# Patient Record
Sex: Male | Born: 1985 | Race: Black or African American | Hispanic: No | Marital: Single | Smoking: Current every day smoker
Health system: Southern US, Community
[De-identification: ages and names within clinical notes are randomized; demographics above are authoritative.]

## PROBLEM LIST (undated history)

## (undated) ENCOUNTER — Ambulatory Visit: Source: Home / Self Care

## (undated) ENCOUNTER — Ambulatory Visit: Admission: EM | Payer: Self-pay | Source: Home / Self Care

---
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
## 2015-09-12 ENCOUNTER — Emergency Department (HOSPITAL_COMMUNITY)
Admission: EM | Admit: 2015-09-12 | Discharge: 2015-09-12 | Disposition: A | Discharge status: Home / Self Care | Payer: Self-pay | Attending: Emergency Medicine | Admitting: Emergency Medicine

## 2015-09-12 ENCOUNTER — Encounter (HOSPITAL_COMMUNITY): Payer: Self-pay | Admitting: Emergency Medicine

## 2015-09-12 DIAGNOSIS — F17200 Nicotine dependence, unspecified, uncomplicated: Secondary | ICD-10-CM | POA: Insufficient documentation

## 2015-09-12 DIAGNOSIS — R369 Urethral discharge, unspecified: Secondary | ICD-10-CM | POA: Insufficient documentation

## 2015-09-12 MED ORDER — LIDOCAINE HCL (PF) 1 % IJ SOLN
5.0000 mL | Freq: Once | INTRAMUSCULAR | Status: AC
  Administered 2015-09-12: 5 mL
  Filled 2015-09-12: qty 5

## 2015-09-12 MED ORDER — AZITHROMYCIN 250 MG PO TABS
1000.0000 mg | ORAL_TABLET | Freq: Once | ORAL | Status: AC
  Administered 2015-09-12: 1000 mg via ORAL
  Filled 2015-09-12: qty 4

## 2015-09-12 MED ORDER — CEFTRIAXONE SODIUM 250 MG IJ SOLR
250.0000 mg | Freq: Once | INTRAMUSCULAR | Status: AC
  Administered 2015-09-12: 250 mg via INTRAMUSCULAR
  Filled 2015-09-12: qty 250

## 2015-09-12 NOTE — ED Notes (Signed)
Pt reports he noticed penile discharge yesterday. Slight burning with urination.

## 2015-09-12 NOTE — ED Provider Notes (Signed)
CSN: [id number]     Arrival date & time 09/12/15  1012 History  By signing my name below, I, Essence Howell, attest that this documentation has been prepared under the direction and in the presence of Roxy Horsemanobert Marticia Reifschneider, PA-C Electronically Signed: Charline BillsEssence Howell, ED Scribe 09/12/2015 at 10:54 AM.   No chief complaint on file.  The history is provided by the patient. No language interpreter was used.   HPI Comments: Gerald [name] is a 30 y.o. male who presents to the Emergency Department with a chief complaint of penile discharge first noticed yesterday. Pt denies new sexual contacts. Pt also denies testicular pain, penile pain, lesions. No known drug allergies.  Reports some associated mild dysuria.   History reviewed. No pertinent past medical history. History reviewed. No pertinent past surgical history. No family history on file. Social History  Substance Use Topics  . Smoking status: Current Every Day Smoker  . Smokeless tobacco: None  . Alcohol Use: No    Review of Systems  Constitutional: Negative for fever.  Genitourinary: Positive for discharge. Negative for genital sores, penile pain and testicular pain.   Allergies  Review of patient's allergies indicates no known allergies.  Home Medications   Prior to Admission medications   Not on File   BP 121/66 mmHg  Pulse 83  Temp(Src) 98.8 F (37.1 C) (Oral)  Resp 14  SpO2 100% Physical Exam Physical Exam  Constitutional: Pt appears well-developed and well-nourished. No distress.  Awake, alert, nontoxic appearance  HENT:  Head: Normocephalic and atraumatic.  Mouth/Throat: Oropharynx is clear and moist. No oropharyngeal exudate.  Eyes: Conjunctivae are normal. No scleral icterus.  Neck: Normal range of motion. Neck supple.  Cardiovascular: Normal rate Pulmonary/Chest: Effort normal and breath sounds normal. No respiratory distress. Equal chest expansion  Abdominal: No distension  Musculoskeletal: Normal range of  motion. Pt exhibits no edema.  GU: No penile discharge. No masses or lesions. No tenderness. No abnormalities. Chaperone present.  Neurological: Pt is alert.  Speech is clear and goal oriented Moves extremities without ataxia  Skin: Skin is warm and dry. Pt is not diaphoretic.  Psychiatric: Pt has a normal mood and affect.  Nursing note and vitals reviewed.  ED Course  Procedures (including critical care time) DIAGNOSTIC STUDIES: Oxygen Saturation is 100% on RA, normal by my interpretation.    COORDINATION OF CARE: 10:40 AM-Discussed treatment plan which includes STD screening, Rocephin and Zithromax with pt at bedside and pt agreed to plan.   Labs Review Labs Reviewed  GC/CHLAMYDIA PROBE AMP ([place]) NOT AT [facility]     MDM   Final diagnoses:  Penile discharge    Discussed importance of using protection when sexually active. Pt understands that they have GC/Chlamydia cultures pending and that they will need to inform all sexual partners if results return positive. Pt has been treated prophylacticly with azithromycin and rocephin due to pts history.   I personally performed the services described in this documentation, which was scribed in my presence. The recorded information has been reviewed and is accurate.     Roxy Horsemanobert Seibert Keeter, PA-C 09/12/15 1103  Donnetta HutchingBrian Cook, MD 09/12/15 1539

## 2015-09-12 NOTE — Discharge Instructions (Signed)
Sexually Transmitted Disease °A sexually transmitted disease (STD) is a disease or infection that may be passed (transmitted) from person to person, usually during sexual activity. This may happen by way of saliva, semen, blood, vaginal mucus, or urine. Common STDs include: °· Gonorrhea. °· Chlamydia. °· Syphilis. °· HIV and AIDS. °· Genital herpes. °· Hepatitis B and C. °· Trichomonas. °· Human papillomavirus (HPV). °· Pubic lice. °· Scabies. °· Mites. °· Bacterial vaginosis. °WHAT ARE CAUSES OF STDs? °An STD may be caused by bacteria, a virus, or parasites. STDs are often transmitted during sexual activity if one person is infected. However, they may also be transmitted through nonsexual means. STDs may be transmitted after:  °· Sexual intercourse with an infected person. °· Sharing sex toys with an infected person. °· Sharing needles with an infected person or using unclean piercing or tattoo needles. °· Having intimate contact with the genitals, mouth, or rectal areas of an infected person. °· Exposure to infected fluids during birth. °WHAT ARE THE SIGNS AND SYMPTOMS OF STDs? °Different STDs have different symptoms. Some people may not have any symptoms. If symptoms are present, they may include: °· Painful or bloody urination. °· Pain in the pelvis, abdomen, vagina, anus, throat, or eyes. °· A skin rash, itching, or irritation. °· Growths, ulcerations, blisters, or sores in the genital and anal areas. °· Abnormal vaginal discharge with or without bad odor. °· Penile discharge in men. °· Fever. °· Pain or bleeding during sexual intercourse. °· Swollen glands in the groin area. °· Yellow skin and eyes (jaundice). This is seen with hepatitis. °· Swollen testicles. °· Infertility. °· Sores and blisters in the mouth. °HOW ARE STDs DIAGNOSED? °To make a diagnosis, your health care provider may: °· Take a medical history. °· Perform a physical exam. °· Take a sample of any discharge to examine. °· Swab the throat,  cervix, opening to the penis, rectum, or vagina for testing. °· Test a sample of your first morning urine. °· Perform blood tests. °· Perform a Pap test, if this applies. °· Perform a colposcopy. °· Perform a laparoscopy. °HOW ARE STDs TREATED? °Treatment depends on the STD. Some STDs may be treated but not cured. °· Chlamydia, gonorrhea, trichomonas, and syphilis can be cured with antibiotic medicine. °· Genital herpes, hepatitis, and HIV can be treated, but not cured, with prescribed medicines. The medicines lessen symptoms. °· Genital warts from HPV can be treated with medicine or by freezing, burning (electrocautery), or surgery. Warts may come back. °· HPV cannot be cured with medicine or surgery. However, abnormal areas may be removed from the cervix, vagina, or vulva. °· If your diagnosis is confirmed, your recent sexual partners need treatment. This is true even if they are symptom-free or have a negative culture or evaluation. They should not have sex until their health care providers say it is okay. °· Your health care provider may test you for infection again 3 months after treatment. °HOW CAN I REDUCE MY RISK OF GETTING AN STD? °Take these steps to reduce your risk of getting an STD: °· Use latex condoms, dental dams, and water-soluble lubricants during sexual activity. Do not use petroleum jelly or oils. °· Avoid having multiple sex partners. °· Do not have sex with someone who has other sex partners °· Do not have sex with anyone you do not know or who is at high risk for an STD. °· Avoid risky sex practices that can break your skin. °· Do not have sex   if you have open sores on your mouth or skin. °· Avoid drinking too much alcohol or taking illegal drugs. Alcohol and drugs can affect your judgment and put you in a vulnerable position. °· Avoid engaging in oral and anal sex acts. °· Get vaccinated for HPV and hepatitis. If you have not received these vaccines in the past, talk to your health care  provider about whether one or both might be right for you. °· If you are at risk of being infected with HIV, it is recommended that you take a prescription medicine daily to prevent HIV infection. This is called pre-exposure prophylaxis (PrEP). You are considered at risk if: °¨ You are a man who has sex with other men (MSM). °¨ You are a heterosexual man or woman and are sexually active with more than one partner. °¨ You take drugs by injection. °¨ You are sexually active with a partner who has HIV. °· Talk with your health care provider about whether you are at high risk of being infected with HIV. If you choose to begin PrEP, you should first be tested for HIV. You should then be tested every 3 months for as long as you are taking PrEP. °WHAT SHOULD I DO IF I THINK I HAVE AN STD? °· See your health care provider. °· Tell your sexual partner(s). They should be tested and treated for any STDs. °· Do not have sex until your health care provider says it is okay. °WHEN SHOULD I GET IMMEDIATE MEDICAL CARE? °Contact your health care provider right away if:  °· You have severe abdominal pain. °· You are a man and notice swelling or pain in your testicles. °· You are a woman and notice swelling or pain in your vagina. °  °This information is not intended to replace advice given to you by your health care provider. Make sure you discuss any questions you have with your health care provider. °  °Document Released: 08/21/2002 Document Revised: 06/21/2014 Document Reviewed: 12/19/2012 °Elsevier Interactive Patient Education ©2016 [employer] ° °

## 2015-09-15 LAB — GC/CHLAMYDIA PROBE AMP (~~LOC~~) NOT AT ARMC
Chlamydia: NEGATIVE
Neisseria Gonorrhea: NEGATIVE

## 2016-01-30 ENCOUNTER — Emergency Department (HOSPITAL_COMMUNITY): Payer: No Typology Code available for payment source

## 2016-01-30 ENCOUNTER — Emergency Department (HOSPITAL_COMMUNITY)
Admission: EM | Admit: 2016-01-30 | Discharge: 2016-01-31 | Disposition: A | Discharge status: Home / Self Care | Payer: No Typology Code available for payment source | Attending: Emergency Medicine | Admitting: Emergency Medicine

## 2016-01-30 ENCOUNTER — Encounter (HOSPITAL_COMMUNITY): Payer: Self-pay | Admitting: Emergency Medicine

## 2016-01-30 DIAGNOSIS — F17200 Nicotine dependence, unspecified, uncomplicated: Secondary | ICD-10-CM | POA: Diagnosis not present

## 2016-01-30 DIAGNOSIS — S60512A Abrasion of left hand, initial encounter: Secondary | ICD-10-CM | POA: Insufficient documentation

## 2016-01-30 DIAGNOSIS — S80212A Abrasion, left knee, initial encounter: Secondary | ICD-10-CM | POA: Insufficient documentation

## 2016-01-30 DIAGNOSIS — S40212A Abrasion of left shoulder, initial encounter: Secondary | ICD-10-CM | POA: Diagnosis not present

## 2016-01-30 DIAGNOSIS — Y999 Unspecified external cause status: Secondary | ICD-10-CM | POA: Insufficient documentation

## 2016-01-30 DIAGNOSIS — S60511A Abrasion of right hand, initial encounter: Secondary | ICD-10-CM | POA: Insufficient documentation

## 2016-01-30 DIAGNOSIS — Z23 Encounter for immunization: Secondary | ICD-10-CM | POA: Diagnosis not present

## 2016-01-30 DIAGNOSIS — Y939 Activity, unspecified: Secondary | ICD-10-CM | POA: Diagnosis not present

## 2016-01-30 DIAGNOSIS — T07XXXA Unspecified multiple injuries, initial encounter: Secondary | ICD-10-CM

## 2016-01-30 DIAGNOSIS — M25532 Pain in left wrist: Secondary | ICD-10-CM | POA: Insufficient documentation

## 2016-01-30 DIAGNOSIS — Y92410 Unspecified street and highway as the place of occurrence of the external cause: Secondary | ICD-10-CM | POA: Diagnosis not present

## 2016-01-30 MED ORDER — TETANUS-DIPHTH-ACELL PERTUSSIS 5-2.5-18.5 LF-MCG/0.5 IM SUSP
0.5000 mL | Freq: Once | INTRAMUSCULAR | Status: AC
  Administered 2016-01-31: 0.5 mL via INTRAMUSCULAR
  Filled 2016-01-30: qty 0.5

## 2016-01-30 MED ORDER — FENTANYL CITRATE (PF) 100 MCG/2ML IJ SOLN
50.0000 ug | Freq: Once | INTRAMUSCULAR | Status: AC
  Administered 2016-01-30: 50 ug via NASAL
  Filled 2016-01-30: qty 2
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
## 2016-01-30 NOTE — ED Notes (Signed)
PT moving both arms above head without difficulty.

## 2016-01-31 MED ORDER — IBUPROFEN 600 MG PO TABS: 600.0000 mg | ORAL_TABLET | Freq: Four times a day (QID) | ORAL | 0 refills | Status: DC | PRN
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
## 2016-11-07 ENCOUNTER — Encounter (HOSPITAL_COMMUNITY): Payer: Self-pay

## 2016-11-07 ENCOUNTER — Emergency Department (HOSPITAL_COMMUNITY)
Admission: EM | Admit: 2016-11-07 | Discharge: 2016-11-07 | Disposition: A | Discharge status: Home / Self Care | Payer: Self-pay | Attending: Emergency Medicine | Admitting: Emergency Medicine

## 2016-11-07 ENCOUNTER — Emergency Department (HOSPITAL_COMMUNITY): Payer: Self-pay

## 2016-11-07 DIAGNOSIS — F17200 Nicotine dependence, unspecified, uncomplicated: Secondary | ICD-10-CM | POA: Insufficient documentation

## 2016-11-07 DIAGNOSIS — N50819 Testicular pain, unspecified: Secondary | ICD-10-CM | POA: Insufficient documentation

## 2016-11-07 DIAGNOSIS — R112 Nausea with vomiting, unspecified: Secondary | ICD-10-CM | POA: Insufficient documentation

## 2016-11-07 DIAGNOSIS — R197 Diarrhea, unspecified: Secondary | ICD-10-CM | POA: Insufficient documentation

## 2016-11-07 LAB — URINALYSIS, ROUTINE W REFLEX MICROSCOPIC
Bilirubin Urine: NEGATIVE
GLUCOSE, UA: NEGATIVE mg/dL
Hgb urine dipstick: NEGATIVE
Ketones, ur: NEGATIVE mg/dL
LEUKOCYTES UA: NEGATIVE
Nitrite: NEGATIVE
PH: 8 (ref 5.0–8.0)
PROTEIN: NEGATIVE mg/dL
SPECIFIC GRAVITY, URINE: 1.011 (ref 1.005–1.030)

## 2016-11-07 LAB — CBC
HEMATOCRIT: 46.7 % (ref 39.0–52.0)
HEMOGLOBIN: 16.8 g/dL (ref 13.0–17.0)
MCH: 32.3 pg (ref 26.0–34.0)
MCHC: 36 g/dL (ref 30.0–36.0)
MCV: 89.8 fL (ref 78.0–100.0)
Platelets: 154 10*3/uL (ref 150–400)
RBC: 5.2 MIL/uL (ref 4.22–5.81)
RDW: 12.8 % (ref 11.5–15.5)
WBC: 4 10*3/uL (ref 4.0–10.5)

## 2016-11-07 LAB — COMPREHENSIVE METABOLIC PANEL
ALT: 13 U/L — ABNORMAL LOW (ref 17–63)
AST: 20 U/L (ref 15–41)
Albumin: 4.5 g/dL (ref 3.5–5.0)
Alkaline Phosphatase: 41 U/L (ref 38–126)
Anion gap: 8 (ref 5–15)
BUN: 10 mg/dL (ref 6–20)
CHLORIDE: 109 mmol/L (ref 101–111)
CO2: 25 mmol/L (ref 22–32)
Calcium: 9.6 mg/dL (ref 8.9–10.3)
Creatinine, Ser: 0.96 mg/dL (ref 0.61–1.24)
Glucose, Bld: 109 mg/dL — ABNORMAL HIGH (ref 65–99)
POTASSIUM: 3.9 mmol/L (ref 3.5–5.1)
SODIUM: 142 mmol/L (ref 135–145)
Total Bilirubin: 0.5 mg/dL (ref 0.3–1.2)
Total Protein: 7.4 g/dL (ref 6.5–8.1)

## 2016-11-07 LAB — LIPASE, BLOOD: LIPASE: 19 U/L (ref 11–51)

## 2016-11-07 MED ORDER — DICYCLOMINE HCL 20 MG PO TABS: 20.0000 mg | ORAL_TABLET | Freq: Two times a day (BID) | ORAL | 0 refills | Status: DC

## 2016-11-07 MED ORDER — KETOROLAC TROMETHAMINE 30 MG/ML IJ SOLN
30.0000 mg | Freq: Once | INTRAMUSCULAR | Status: AC
  Administered 2016-11-07: 30 mg via INTRAVENOUS
  Filled 2016-11-07: qty 1

## 2016-11-07 MED ORDER — SODIUM CHLORIDE 0.9 % IV BOLUS (SEPSIS)
1000.0000 mL | Freq: Once | INTRAVENOUS | Status: AC
  Administered 2016-11-07: 1000 mL via INTRAVENOUS

## 2016-11-07 MED ORDER — ONDANSETRON HCL 4 MG/2ML IJ SOLN
4.0000 mg | Freq: Once | INTRAMUSCULAR | Status: AC
  Administered 2016-11-07: 4 mg via INTRAVENOUS
  Filled 2016-11-07: qty 2

## 2016-11-07 MED ORDER — ONDANSETRON 4 MG PO TBDP: 4.0000 mg | ORAL_TABLET | Freq: Three times a day (TID) | ORAL | 0 refills | Status: DC | PRN

## 2016-11-07 NOTE — ED Notes (Signed)
Pt returned to room from Ultrasound.

## 2016-11-07 NOTE — Discharge Instructions (Signed)
Take the prescribed medication as directed. °Recommend gentle diet for now and progress back to normal as tolerated. °Follow-up with your primary care doctor. °Return to the ED for new or worsening symptoms. °

## 2016-11-07 NOTE — ED Triage Notes (Signed)
Patient of abdominal pain with cramping with vomiting and diarrhea since eating at cookout last pm. States that the pain radiates into his pelvis as well
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
## 2017-02-20 ENCOUNTER — Emergency Department (HOSPITAL_COMMUNITY)
Admission: EM | Admit: 2017-02-20 | Discharge: 2017-02-20 | Disposition: A | Discharge status: Home / Self Care | Payer: No Typology Code available for payment source | Attending: Emergency Medicine | Admitting: Emergency Medicine

## 2017-02-20 ENCOUNTER — Encounter (HOSPITAL_COMMUNITY): Payer: Self-pay

## 2017-02-20 DIAGNOSIS — M542 Cervicalgia: Secondary | ICD-10-CM | POA: Insufficient documentation

## 2017-02-20 DIAGNOSIS — F17210 Nicotine dependence, cigarettes, uncomplicated: Secondary | ICD-10-CM | POA: Insufficient documentation

## 2017-02-20 DIAGNOSIS — M546 Pain in thoracic spine: Secondary | ICD-10-CM | POA: Diagnosis not present

## 2017-02-20 DIAGNOSIS — M545 Low back pain, unspecified: Secondary | ICD-10-CM

## 2017-02-20 MED ORDER — CYCLOBENZAPRINE HCL 10 MG PO TABS: 10.0000 mg | ORAL_TABLET | Freq: Every day | ORAL | 0 refills | Status: DC
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
## 2017-02-20 NOTE — ED Notes (Signed)
Declined W/C at D/C and was escorted to lobby by RN. 

## 2017-02-20 NOTE — Discharge Instructions (Signed)
Alternate 600 mg of ibuprofen and 500-1000 mg of Tylenol every 3 hours as needed for pain. Do not exceed 4000 mg of Tylenol daily. You may take flexeril for muscle relaxation but do not drive, drink alcohol or operate machinery with flexeril use. Ice to areas of soreness for the next few days and then may move to heat. Take hot showers or hot baths and do some gentle stretching to ease muscle stiffness. Take short, frequent walks throughout the day to avoid stiffness and pain. Expect to be sore for the next few day and follow up with primary care physician for recheck of ongoing symptoms but return to ER for emergent changing or worsening of symptoms such as loss of control of bowel or bladder, altered mental status, or abnormal gait not due to pain. ° °
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
## 2017-03-20 ENCOUNTER — Emergency Department (HOSPITAL_COMMUNITY)
Admission: EM | Admit: 2017-03-20 | Discharge: 2017-03-21 | Disposition: A | Discharge status: Home / Self Care | Payer: Self-pay | Attending: Emergency Medicine | Admitting: Emergency Medicine

## 2017-03-20 ENCOUNTER — Encounter (HOSPITAL_COMMUNITY): Payer: Self-pay | Admitting: Emergency Medicine

## 2017-03-20 DIAGNOSIS — R8281 Pyuria: Secondary | ICD-10-CM

## 2017-03-20 DIAGNOSIS — Z5321 Procedure and treatment not carried out due to patient leaving prior to being seen by health care provider: Secondary | ICD-10-CM | POA: Insufficient documentation

## 2017-03-20 DIAGNOSIS — N390 Urinary tract infection, site not specified: Secondary | ICD-10-CM | POA: Insufficient documentation

## 2017-03-20 NOTE — ED Triage Notes (Signed)
Pt comes after reporting being on vacation and came back and noticed he had some penile discharge.  Cannot note color or consistency. Denies pain. Last intercourse on Wednesday and unprotected. Ambulatory. A&O x4.

## 2017-03-21 ENCOUNTER — Emergency Department (HOSPITAL_COMMUNITY)
Admission: EM | Admit: 2017-03-21 | Discharge: 2017-03-21 | Disposition: A | Discharge status: Home / Self Care | Payer: Self-pay | Attending: Emergency Medicine | Admitting: Emergency Medicine

## 2017-03-21 ENCOUNTER — Encounter (HOSPITAL_COMMUNITY): Payer: Self-pay | Admitting: Emergency Medicine

## 2017-03-21 DIAGNOSIS — F17210 Nicotine dependence, cigarettes, uncomplicated: Secondary | ICD-10-CM | POA: Insufficient documentation

## 2017-03-21 DIAGNOSIS — R369 Urethral discharge, unspecified: Secondary | ICD-10-CM | POA: Insufficient documentation

## 2017-03-21 DIAGNOSIS — Z79899 Other long term (current) drug therapy: Secondary | ICD-10-CM | POA: Insufficient documentation

## 2017-03-21 LAB — URINALYSIS, ROUTINE W REFLEX MICROSCOPIC
BACTERIA UA: NONE SEEN
BILIRUBIN URINE: NEGATIVE
Bilirubin Urine: NEGATIVE
Glucose, UA: NEGATIVE mg/dL
Glucose, UA: NEGATIVE mg/dL
KETONES UR: NEGATIVE mg/dL
KETONES UR: NEGATIVE mg/dL
Nitrite: NEGATIVE
Nitrite: NEGATIVE
PH: 6 (ref 5.0–8.0)
Protein, ur: 30 mg/dL — AB
Protein, ur: NEGATIVE mg/dL
SPECIFIC GRAVITY, URINE: 1.013 (ref 1.005–1.030)
SQUAMOUS EPITHELIAL / LPF: NONE SEEN
SQUAMOUS EPITHELIAL / LPF: NONE SEEN
Specific Gravity, Urine: 1.012 (ref 1.005–1.030)
pH: 6 (ref 5.0–8.0)

## 2017-03-21 LAB — GC/CHLAMYDIA PROBE AMP (~~LOC~~) NOT AT ARMC
CHLAMYDIA, DNA PROBE: NEGATIVE
Neisseria Gonorrhea: POSITIVE — AB

## 2017-03-21 MED ORDER — LIDOCAINE HCL (PF) 1 % IJ SOLN
2.0000 mL | Freq: Once | INTRAMUSCULAR | Status: AC
  Administered 2017-03-21: 0.9 mL via INTRADERMAL
  Filled 2017-03-21: qty 5

## 2017-03-21 MED ORDER — AZITHROMYCIN 250 MG PO TABS
1000.0000 mg | ORAL_TABLET | Freq: Once | ORAL | Status: AC
  Administered 2017-03-21: 1000 mg via ORAL
  Filled 2017-03-21: qty 4

## 2017-03-21 MED ORDER — CEFTRIAXONE SODIUM 250 MG IJ SOLR
250.0000 mg | Freq: Once | INTRAMUSCULAR | Status: AC
  Administered 2017-03-21: 250 mg via INTRAMUSCULAR
  Filled 2017-03-21: qty 250
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
## 2017-03-21 NOTE — ED Notes (Signed)
Got a page from triage staff notifying me that pt was seen walking out to the lobby, indicated he would not be coming back.

## 2017-03-21 NOTE — ED Provider Notes (Signed)
[facility]PT Provider Note   CSN: [id number] Arrival date & time: 03/21/17  0100     History   Chief Complaint Chief Complaint  Patient presents with  . Penile Discharge    HPI Gerald [name] is a 31 y.o. male.  Patient presents emergency department with chief complaint penile discharge. He states symptoms started today. He denies any associated fevers, chills, nausea, vomiting, abdominal pain, or testicle pain. States that he does have some associated dysuria. He reports new sexual contacts. He denies any other associated symptoms. He has not taken anything for symptoms.   The history is provided by the patient. No language interpreter was used.    History reviewed. No pertinent past medical history.  There are no active problems to display for this patient.   History reviewed. No pertinent surgical history.     Home Medications    Prior to Admission medications   Medication Sig Start Date End Date Taking? Authorizing Provider  cyclobenzaprine (FLEXERIL) 10 MG tablet Take 1 tablet (10 mg total) by mouth at bedtime. 02/20/17   Fawze, Mina A, PA-C  dicyclomine (BENTYL) 20 MG tablet Take 1 tablet (20 mg total) by mouth 2 (two) times daily. 11/07/16   Garlon Hatchet, PA-C  ibuprofen (ADVIL,MOTRIN) 600 MG tablet Take 1 tablet (600 mg total) by mouth every 6 (six) hours as needed. Patient not taking: Reported on 11/07/2016 01/31/16   Felicie Morn, NP  ondansetron (ZOFRAN ODT) 4 MG disintegrating tablet Take 1 tablet (4 mg total) by mouth every 8 (eight) hours as needed for nausea. 11/07/16   Garlon Hatchet, PA-C    Family History No family history on file.  Social History Social History  Substance Use Topics  . Smoking status: Current Every Day Smoker    Packs/day: 0.40    Types: Cigarettes  . Smokeless tobacco: Never Used  . Alcohol use No     Allergies   Patient has no known allergies.   Review of Systems Review of Systems  All other systems reviewed  and are negative.    Physical Exam Updated Vital Signs BP 118/73 (BP Location: Right Arm)   Pulse 81   Temp 98.2 F (36.8 C) (Oral)   Resp 16   SpO2 100%   Physical Exam  Constitutional: He is oriented to person, place, and time. He appears well-developed and well-nourished.  HENT:  Head: Normocephalic and atraumatic.  Eyes: Conjunctivae and EOM are normal.  Neck: Normal range of motion.  Cardiovascular: Normal rate.   Pulmonary/Chest: Effort normal.  Abdominal: He exhibits no distension.  Genitourinary:  Genitourinary Comments: Thick white discharge from meatus, no testicular tenderness, no masses, lesions, or other abnormality  Musculoskeletal: Normal range of motion.  Neurological: He is alert and oriented to person, place, and time.  Skin: Skin is dry.  Psychiatric: He has a normal mood and affect. His behavior is normal. Judgment and thought content normal.  Nursing note and vitals reviewed.    ED Treatments / Results  Labs (all labs ordered are listed, but only abnormal results are displayed) Labs Reviewed  URINALYSIS, ROUTINE W REFLEX MICROSCOPIC - Abnormal; Notable for the following:       Result Value   APPearance CLOUDY (*)    Hgb urine dipstick SMALL (*)    Leukocytes, UA LARGE (*)    Bacteria, UA RARE (*)    All other components within normal limits    EKG  EKG Interpretation None  Radiology No results found.  Procedures Procedures (including critical care time)  Medications Ordered in ED Medications  cefTRIAXone (ROCEPHIN) injection 250 mg (not administered)  azithromycin (ZITHROMAX) tablet 1,000 mg (not administered)     Initial Impression / Assessment and Plan / ED Course  I have reviewed the triage vital signs and the nursing notes.  Pertinent labs & imaging results that were available during my care of the patient were reviewed by me and considered in my medical decision making (see chart for details).     Patient with  penile discharge.  Cultures pending.  Treated with rocephin and azithro in the ED.  Safe sex instructions given.  No pain or tenderness.  VSS.  Final Clinical Impressions(s) / ED Diagnoses   Final diagnoses:  Penile discharge    New Prescriptions New Prescriptions   No medications on file     Roxy Horseman, Cordelia Poche 03/21/17 0235    Gilda Crease, MD 03/21/17 [phone]

## 2017-03-23 LAB — GC/CHLAMYDIA PROBE AMP (~~LOC~~) NOT AT ARMC
Chlamydia: POSITIVE — AB
NEISSERIA GONORRHEA: POSITIVE — AB

## 2017-12-12 ENCOUNTER — Encounter (HOSPITAL_COMMUNITY): Payer: Self-pay | Admitting: Emergency Medicine

## 2017-12-12 ENCOUNTER — Other Ambulatory Visit: Payer: Self-pay

## 2017-12-12 ENCOUNTER — Emergency Department (HOSPITAL_COMMUNITY)
Admission: EM | Admit: 2017-12-12 | Discharge: 2017-12-12 | Disposition: A | Discharge status: Home / Self Care | Payer: Self-pay | Attending: Emergency Medicine | Admitting: Emergency Medicine

## 2017-12-12 ENCOUNTER — Emergency Department (HOSPITAL_COMMUNITY): Payer: Self-pay

## 2017-12-12 DIAGNOSIS — Y939 Activity, unspecified: Secondary | ICD-10-CM | POA: Insufficient documentation

## 2017-12-12 DIAGNOSIS — Y929 Unspecified place or not applicable: Secondary | ICD-10-CM | POA: Insufficient documentation

## 2017-12-12 DIAGNOSIS — Y999 Unspecified external cause status: Secondary | ICD-10-CM | POA: Insufficient documentation

## 2017-12-12 DIAGNOSIS — W51XXXA Accidental striking against or bumped into by another person, initial encounter: Secondary | ICD-10-CM | POA: Insufficient documentation

## 2017-12-12 DIAGNOSIS — F17210 Nicotine dependence, cigarettes, uncomplicated: Secondary | ICD-10-CM | POA: Insufficient documentation

## 2017-12-12 DIAGNOSIS — Z79899 Other long term (current) drug therapy: Secondary | ICD-10-CM | POA: Insufficient documentation

## 2017-12-12 DIAGNOSIS — S63502A Unspecified sprain of left wrist, initial encounter: Secondary | ICD-10-CM | POA: Insufficient documentation
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
## 2017-12-12 NOTE — Discharge Instructions (Signed)
Please wear splint until you are able to follow up with Dr. Izora Ribasoley (hand doctor) Take Ibuprofen three times daily for pain and swelling
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
## 2017-12-12 NOTE — ED Notes (Signed)
Patient transported to X-ray 

## 2019-07-09 ENCOUNTER — Encounter (HOSPITAL_COMMUNITY): Payer: Self-pay

## 2019-07-09 ENCOUNTER — Emergency Department (HOSPITAL_COMMUNITY)
Admission: EM | Admit: 2019-07-09 | Discharge: 2019-07-09 | Disposition: A | Discharge status: Home / Self Care | Payer: Self-pay | Attending: Emergency Medicine | Admitting: Emergency Medicine

## 2019-07-09 DIAGNOSIS — Z202 Contact with and (suspected) exposure to infections with a predominantly sexual mode of transmission: Secondary | ICD-10-CM | POA: Insufficient documentation

## 2019-07-09 DIAGNOSIS — R300 Dysuria: Secondary | ICD-10-CM | POA: Insufficient documentation

## 2019-07-09 DIAGNOSIS — F17210 Nicotine dependence, cigarettes, uncomplicated: Secondary | ICD-10-CM | POA: Insufficient documentation

## 2019-07-09 DIAGNOSIS — Z79899 Other long term (current) drug therapy: Secondary | ICD-10-CM | POA: Insufficient documentation

## 2019-07-09 LAB — URINALYSIS, ROUTINE W REFLEX MICROSCOPIC
Bilirubin Urine: NEGATIVE
Glucose, UA: NEGATIVE mg/dL
Hgb urine dipstick: NEGATIVE
Ketones, ur: NEGATIVE mg/dL
Nitrite: NEGATIVE
Protein, ur: NEGATIVE mg/dL
Specific Gravity, Urine: 1.013 (ref 1.005–1.030)
pH: 5 (ref 5.0–8.0)

## 2019-07-09 MED ORDER — DOXYCYCLINE HYCLATE 100 MG PO CAPS: 100.0000 mg | ORAL_CAPSULE | Freq: Two times a day (BID) | ORAL | 0 refills | Status: DC

## 2019-07-09 MED ORDER — STERILE WATER FOR INJECTION IJ SOLN
INTRAMUSCULAR | Status: AC
  Administered 2019-07-09: 10 mL
  Filled 2019-07-09: qty 10

## 2019-07-09 MED ORDER — CEFTRIAXONE SODIUM 1 G IJ SOLR
500.0000 mg | Freq: Once | INTRAMUSCULAR | Status: AC
  Administered 2019-07-09: 500 mg via INTRAMUSCULAR
  Filled 2019-07-09: qty 10

## 2019-07-09 MED ORDER — AZITHROMYCIN 250 MG PO TABS
1000.0000 mg | ORAL_TABLET | Freq: Once | ORAL | Status: AC
  Administered 2019-07-09: 1000 mg via ORAL
  Filled 2019-07-09: qty 4
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
## 2019-07-09 NOTE — ED Provider Notes (Signed)
[facility] DEPT Provider Note   CSN: [id number] Arrival date & time: 07/09/19  0102     History Chief Complaint  Patient presents with  . Exposure to STD    Gerald [name] is a 34 y.o. male.  Patient states that a woman he had sex with told him that she has an STD.  Patient denies any symptoms himself but has been anxious since.  He has a little bit of tingling when he urinates.  There has been no penile discharge.  No hematuria but some dysuria.  No fever, chills, nausea, vomiting.  No abdominal pain.  No chest pain or shortness of breath. His last sexual encounter was 4 days ago.  Does not use condoms.  The history is provided by the patient.  Exposure to STD Pertinent negatives include no chest pain, no abdominal pain, no headaches and no shortness of breath.       History reviewed. No pertinent past medical history.  There are no problems to display for this patient.   History reviewed. No pertinent surgical history.     No family history on file.  Social History   Tobacco Use  . Smoking status: Current Every Day Smoker    Packs/day: 0.40    Types: Cigarettes  . Smokeless tobacco: Never Used  Substance Use Topics  . Alcohol use: No  . Drug use: No    Home Medications Prior to Admission medications   Medication Sig Start Date End Date Taking? Authorizing Provider  cyclobenzaprine (FLEXERIL) 10 MG tablet Take 1 tablet (10 mg total) by mouth at bedtime. 02/20/17   Fawze, Mina A, PA-C  dicyclomine (BENTYL) 20 MG tablet Take 1 tablet (20 mg total) by mouth 2 (two) times daily. 11/07/16   Larene Pickett, PA-C  ibuprofen (ADVIL,MOTRIN) 600 MG tablet Take 1 tablet (600 mg total) by mouth every 6 (six) hours as needed. Patient not taking: Reported on 11/07/2016 01/31/16   Etta Quill, NP  ondansetron (ZOFRAN ODT) 4 MG disintegrating tablet Take 1 tablet (4 mg total) by mouth every 8 (eight) hours as needed for nausea. 11/07/16   Larene Pickett, PA-C    Allergies    Patient has no known allergies.  Review of Systems   Review of Systems  Constitutional: Negative for activity change, appetite change and fever.  HENT: Negative for congestion.   Respiratory: Negative for cough, chest tightness and shortness of breath.   Cardiovascular: Negative for chest pain.  Gastrointestinal: Negative for abdominal pain, nausea and vomiting.  Genitourinary: Positive for dysuria. Negative for decreased urine volume, discharge, hematuria, penile pain, scrotal swelling, testicular pain and urgency.  Musculoskeletal: Negative for arthralgias and myalgias.  Skin: Negative for rash.  Neurological: Negative for weakness and headaches.    all other systems are negative except as noted in the HPI and PMH.   Physical Exam Updated Vital Signs BP 126/78 (BP Location: Right Arm)   Pulse 86   Temp 98.3 F (36.8 C)   Resp 16   SpO2 100%   Physical Exam Vitals and nursing note reviewed.  Constitutional:      General: He is not in acute distress.    Appearance: He is well-developed.     Comments: Smells of marijuana  HENT:     Head: Normocephalic and atraumatic.     Mouth/Throat:     Pharynx: No oropharyngeal exudate.  Eyes:     Conjunctiva/sclera: Conjunctivae normal.     Pupils: Pupils  are equal, round, and reactive to light.  Neck:     Comments: No meningismus. Cardiovascular:     Rate and Rhythm: Normal rate and regular rhythm.     Heart sounds: Normal heart sounds. No murmur.  Pulmonary:     Effort: Pulmonary effort is normal. No respiratory distress.     Breath sounds: Normal breath sounds.  Abdominal:     Palpations: Abdomen is soft.     Tenderness: There is no abdominal tenderness. There is no guarding or rebound.  Genitourinary:    Comments: No rashes.  No penile discharge.  Normal circumcised penis.  No testicular tenderness Musculoskeletal:        General: No tenderness. Normal range of motion.     Cervical back:  Normal range of motion and neck supple.  Skin:    General: Skin is warm.  Neurological:     Mental Status: He is alert and oriented to person, place, and time.     Cranial Nerves: No cranial nerve deficit.     Motor: No abnormal muscle tone.     Coordination: Coordination normal.     Comments: No ataxia on finger to nose bilaterally. No pronator drift. 5/5 strength throughout. CN 2-12 intact.Equal grip strength. Sensation intact.   Psychiatric:        Behavior: Behavior normal.     ED Results / Procedures / Treatments   Labs (all labs ordered are listed, but only abnormal results are displayed) Labs Reviewed  URINALYSIS, ROUTINE W REFLEX MICROSCOPIC - Abnormal; Notable for the following components:      Result Value   Leukocytes,Ua TRACE (*)    Bacteria, UA RARE (*)    All other components within normal limits  URINE CULTURE    EKG None  Radiology No results found.  Procedures Procedures (including critical care time)  Medications Ordered in ED Medications  cefTRIAXone (ROCEPHIN) injection 500 mg (has no administration in time range)  azithromycin (ZITHROMAX) tablet 1,000 mg (has no administration in time range)    ED Course  I have reviewed the triage vital signs and the nursing notes.  Pertinent labs & imaging results that were available during my care of the patient were reviewed by me and considered in my medical decision making (see chart for details).    MDM Rules/Calculators/A&P                      Patient here with possible STD exposure.  He has no symptoms.  No fever. Abdomen soft without peritoneal signs.  We will treat empirically with IM Rocephin as well as p.o. doxycycline.  Safe sex practices discussed.  Follow-up with health department.  Return precautions discussed Final Clinical Impression(s) / ED Diagnoses Final diagnoses:  STD exposure    Rx / DC Orders ED Discharge Orders    None       Tylin Stradley, Jeannett Senior, MD 07/09/19 1111

## 2019-07-09 NOTE — Discharge Instructions (Signed)
Use condoms for every sexual encounter.  Follow-up with the health department.  Return to the ED with new or worsening symptoms.

## 2019-07-10 LAB — URINE CULTURE: Culture: NO GROWTH

## 2019-09-29 ENCOUNTER — Emergency Department (HOSPITAL_COMMUNITY)
Admission: EM | Admit: 2019-09-29 | Discharge: 2019-09-29 | Disposition: A | Discharge status: Home / Self Care | Payer: Self-pay | Attending: Emergency Medicine | Admitting: Emergency Medicine

## 2019-09-29 ENCOUNTER — Other Ambulatory Visit: Payer: Self-pay

## 2019-09-29 ENCOUNTER — Encounter (HOSPITAL_COMMUNITY): Payer: Self-pay

## 2019-09-29 DIAGNOSIS — F17210 Nicotine dependence, cigarettes, uncomplicated: Secondary | ICD-10-CM | POA: Insufficient documentation

## 2019-09-29 DIAGNOSIS — K0889 Other specified disorders of teeth and supporting structures: Secondary | ICD-10-CM | POA: Insufficient documentation

## 2019-09-29 MED ORDER — PENICILLIN V POTASSIUM 500 MG PO TABS: 500.0000 mg | ORAL_TABLET | Freq: Four times a day (QID) | ORAL | 0 refills | Status: AC

## 2019-09-29 MED ORDER — CHLORHEXIDINE GLUCONATE 0.12 % MT SOLN: 15.0000 mL | Freq: Two times a day (BID) | OROMUCOSAL | 0 refills | Status: DC

## 2019-09-29 NOTE — ED Notes (Signed)
Patient verbalizes understanding of discharge instructions. Opportunity for questioning and answers were provided. Armband removed by staff, pt discharged from ED. Pt. ambulatory and discharged home.  

## 2019-09-29 NOTE — ED Provider Notes (Signed)
[facility] EMERGENCY DEPARTMENT Provider Note   CSN: [id number] Arrival date & time: 09/29/19  1700     History Chief Complaint  Patient presents with  . Dental Pain    Gerald [name] is a 34 y.o. male.  Gerald [name] is a 34 y.o. male who is otherwise healthy, presents to the emergency department with dental pain.  He reports last night while eating an apple his left lower posterior molar broke, and he spit out a piece of tooth.  He reports since then he has had some throbbing pain around the tooth that is a constant ache.  He has not taken anything to treat this pain prior to arrival.  No drainage from the area.  No pain or swelling underneath the tongue.  No fevers or chills.  Chewing is painful but he has been able to eat and drink.  Does not have a dentist that he sees.  No other aggravating or alleviating factors.        History reviewed. No pertinent past medical history.  There are no problems to display for this patient.   History reviewed. No pertinent surgical history.     No family history on file.  Social History   Tobacco Use  . Smoking status: Current Every Day Smoker    Packs/day: 0.40    Types: Cigarettes  . Smokeless tobacco: Never Used  Substance Use Topics  . Alcohol use: No  . Drug use: No    Home Medications Prior to Admission medications   Medication Sig Start Date End Date Taking? Authorizing Provider  chlorhexidine (PERIDEX) 0.12 % solution Use as directed 15 mLs in the mouth or throat 2 (two) times daily. 09/29/19   Dartha Lodge, PA-C  cyclobenzaprine (FLEXERIL) 10 MG tablet Take 1 tablet (10 mg total) by mouth at bedtime. Patient not taking: Reported on 07/09/2019 02/20/17   Michela Pitcher A, PA-C  dicyclomine (BENTYL) 20 MG tablet Take 1 tablet (20 mg total) by mouth 2 (two) times daily. Patient not taking: Reported on 07/09/2019 11/07/16   Garlon Hatchet, PA-C  doxycycline (VIBRAMYCIN) 100 MG capsule Take 1 capsule  (100 mg total) by mouth 2 (two) times daily. 07/09/19   Rancour, Jeannett Senior, MD  ibuprofen (ADVIL,MOTRIN) 600 MG tablet Take 1 tablet (600 mg total) by mouth every 6 (six) hours as needed. Patient not taking: Reported on 11/07/2016 01/31/16   Felicie Morn, NP  ondansetron (ZOFRAN ODT) 4 MG disintegrating tablet Take 1 tablet (4 mg total) by mouth every 8 (eight) hours as needed for nausea. Patient not taking: Reported on 07/09/2019 11/07/16   Garlon Hatchet, PA-C  penicillin v potassium (VEETID) 500 MG tablet Take 1 tablet (500 mg total) by mouth 4 (four) times daily for 7 days. 09/29/19 10/06/19  Dartha Lodge, PA-C    Allergies    Patient has no known allergies.  Review of Systems   Review of Systems  Constitutional: Negative for chills and fever.  HENT: Positive for dental problem. Negative for facial swelling, trouble swallowing and voice change.   Gastrointestinal: Negative for nausea and vomiting.  Musculoskeletal: Negative for neck pain and neck stiffness.    Physical Exam Updated Vital Signs BP 107/81 (BP Location: Right Arm)   Pulse 73   Temp 98.5 F (36.9 C) (Oral)   Resp 14   Ht 5\' 9"  (1.753 m)   Wt 63.5 kg   SpO2 98%   BMI 20.67 kg/m   Physical  Exam Vitals and nursing note reviewed.  Constitutional:      General: He is not in acute distress.    Appearance: Normal appearance. He is well-developed and normal weight. He is not diaphoretic.  HENT:     Head: Normocephalic and atraumatic.     Mouth/Throat:     Comments: Left lower molar is broken, no bleeding or purulent drainage noted no erythema of the surrounding gums or evident abscess, multiple teeth noted to have some decay.  Posterior oropharynx is clear, tolerating secretions, normal phonation, no sublingual tenderness Eyes:     General:        Right eye: No discharge.        Left eye: No discharge.  Pulmonary:     Effort: Pulmonary effort is normal. No respiratory distress.  Musculoskeletal:     Cervical back:  Neck supple. No tenderness.  Skin:    General: Skin is warm and dry.  Neurological:     Mental Status: He is alert and oriented to person, place, and time.     Coordination: Coordination normal.  Psychiatric:        Mood and Affect: Mood normal.        Behavior: Behavior normal.     ED Results / Procedures / Treatments   Labs (all labs ordered are listed, but only abnormal results are displayed) Labs Reviewed - No data to display  EKG None  Radiology No results found.  Procedures Procedures (including critical care time)  Medications Ordered in ED Medications - No data to display  ED Course  I have reviewed the triage vital signs and the nursing notes.  Pertinent labs & imaging results that were available during my care of the patient were reviewed by me and considered in my medical decision making (see chart for details).    MDM Rules/Calculators/A&P                     34 year old male presents with broken left lower posterior molar.  This has been broken for almost 24 hours now, and is broken down to the gumline so do not feel that patient is a candidate for having the tooth sealed with dental cement.  Will place on prophylactic antibiotics and have patient use Peridex mouthwash.  Ibuprofen and Tylenol for pain.  Provided a list of dental resources for follow-up.  Discussed appropriate return precautions.  Patient expresses understanding and agreement.  Discharged home in good condition.  Final Clinical Impression(s) / ED Diagnoses Final diagnoses:  Tooth pain    Rx / DC Orders ED Discharge Orders         Ordered    penicillin v potassium (VEETID) 500 MG tablet  4 times daily     09/29/19 1933    chlorhexidine (PERIDEX) 0.12 % solution  2 times daily     09/29/19 1933           [name] 09/29/19 1945    [name], MD 09/30/19 1108

## 2019-09-29 NOTE — Discharge Instructions (Signed)
Please take entire course of antibiotics as directed.  Continue using ibuprofen and Tylenol, as well as prescribed Orajel for pain.  You will need to follow-up with your dentist for continued management of this.  Return to the emergency department for fevers, swelling or pain under the tongue or in the neck, difficulty breathing or swallowing or any other new or concerning symptoms. 

## 2019-09-29 NOTE — ED Triage Notes (Signed)
Patient complains of left lower dental pain for 1 day, states where wisdom tooth has come through

## 2019-12-11 ENCOUNTER — Encounter (HOSPITAL_COMMUNITY): Payer: Self-pay | Admitting: *Deleted

## 2019-12-11 ENCOUNTER — Other Ambulatory Visit: Payer: Self-pay

## 2019-12-11 ENCOUNTER — Emergency Department (HOSPITAL_COMMUNITY)
Admission: EM | Admit: 2019-12-11 | Discharge: 2019-12-11 | Disposition: A | Discharge status: Home / Self Care | Payer: Self-pay | Attending: Emergency Medicine | Admitting: Emergency Medicine

## 2019-12-11 DIAGNOSIS — F17210 Nicotine dependence, cigarettes, uncomplicated: Secondary | ICD-10-CM | POA: Insufficient documentation

## 2019-12-11 DIAGNOSIS — K0889 Other specified disorders of teeth and supporting structures: Secondary | ICD-10-CM | POA: Insufficient documentation

## 2019-12-11 MED ORDER — NAPROXEN 500 MG PO TABS: 500.0000 mg | ORAL_TABLET | Freq: Two times a day (BID) | ORAL | 0 refills | Status: DC

## 2019-12-11 MED ORDER — PENICILLIN V POTASSIUM 500 MG PO TABS: 500.0000 mg | ORAL_TABLET | Freq: Three times a day (TID) | ORAL | 0 refills | Status: AC

## 2019-12-11 NOTE — ED Provider Notes (Signed)
[facility]-EMERGENCY DEPT Provider Note   CSN: [id number] Arrival date & time: 12/11/19  0944    History Chief Complaint  Patient presents with  . Dental Pain    Gerald [name] is a 34 y.o. male with no significant past medical history who presents for evaluation of dental pain.  Patient with multiple fractured teeth to his left lower dentition.  Patient states he recently chipped an additional tooth.  He was seen by dentistry previously who told him it was going to be a certain amount and patient states he cannot afford it.  Patient states "cant you remove tooth."  No headache, lightheadedness, dizziness, drooling, dysphagia, trismus, bleeding, neck pain, neck stiffness, fever, chills, nausea, vomiting, chest pain, shortness of breath or cough.  Denies aggravating or relieving factors.  States he did take antibiotics approximately 1 month ago.  History obtained from patient and past medical records.  No interpreter used.  HPI     History reviewed. No pertinent past medical history.  There are no problems to display for this patient.   History reviewed. No pertinent surgical history.     No family history on file.  Social History   Tobacco Use  . Smoking status: Current Every Day Smoker    Packs/day: 0.40    Types: Cigarettes  . Smokeless tobacco: Never Used  Substance Use Topics  . Alcohol use: No  . Drug use: No    Home Medications Prior to Admission medications   Medication Sig Start Date End Date Taking? Authorizing Provider  chlorhexidine (PERIDEX) 0.12 % solution Use as directed 15 mLs in the mouth or throat 2 (two) times daily. 09/29/19   Dartha Lodge, PA-C  cyclobenzaprine (FLEXERIL) 10 MG tablet Take 1 tablet (10 mg total) by mouth at bedtime. Patient not taking: Reported on 07/09/2019 02/20/17   Michela Pitcher A, PA-C  dicyclomine (BENTYL) 20 MG tablet Take 1 tablet (20 mg total) by mouth 2 (two) times daily. Patient not taking: Reported  on 07/09/2019 11/07/16   Garlon Hatchet, PA-C  doxycycline (VIBRAMYCIN) 100 MG capsule Take 1 capsule (100 mg total) by mouth 2 (two) times daily. 07/09/19   Rancour, Jeannett Senior, MD  ibuprofen (ADVIL,MOTRIN) 600 MG tablet Take 1 tablet (600 mg total) by mouth every 6 (six) hours as needed. Patient not taking: Reported on 11/07/2016 01/31/16   Felicie Morn, NP  naproxen (NAPROSYN) 500 MG tablet Take 1 tablet (500 mg total) by mouth 2 (two) times daily. 12/11/19   Meighan Treto A, PA-C  ondansetron (ZOFRAN ODT) 4 MG disintegrating tablet Take 1 tablet (4 mg total) by mouth every 8 (eight) hours as needed for nausea. Patient not taking: Reported on 07/09/2019 11/07/16   Garlon Hatchet, PA-C  penicillin v potassium (VEETID) 500 MG tablet Take 1 tablet (500 mg total) by mouth 3 (three) times daily for 7 days. 12/11/19 12/18/19  Isella Slatten A, PA-C    Allergies    Patient has no known allergies.  Review of Systems   Review of Systems  Constitutional: Negative.   HENT: Positive for dental problem and facial swelling. Negative for congestion, ear discharge, ear pain, mouth sores, nosebleeds, postnasal drip, rhinorrhea, sinus pressure, sinus pain, sneezing, sore throat, trouble swallowing and voice change.   Eyes: Negative.   Respiratory: Negative.   Cardiovascular: Negative.   Gastrointestinal: Negative.   Musculoskeletal: Negative for neck pain and neck stiffness.  Skin: Negative.   Neurological: Negative.   All other systems reviewed  and are negative.   Physical Exam Updated Vital Signs BP 129/80 (BP Location: Right Arm)   Pulse 85   Temp 98.2 F (36.8 C) (Oral)   Resp 16   Ht 5\' 9"  (1.753 m)   Wt 63.5 kg   SpO2 100%   BMI 20.67 kg/m   Physical Exam Vitals and nursing note reviewed.  Constitutional:      General: He is not in acute distress.    Appearance: He is well-developed. He is not ill-appearing, toxic-appearing or diaphoretic.  HENT:     Head: Atraumatic.     Jaw: There is  normal jaw occlusion.     Comments: Minimal left-sided facial swelling to superior to mandible.  No extension into submandibular region.  No evidence of Ludwig's angina    Mouth/Throat:     Lips: Pink.     Mouth: Mucous membranes are moist.     Dentition: Abnormal dentition. Dental tenderness and dental caries present. No gingival swelling or dental abscesses.     Tongue: No lesions.     Palate: No mass.     Pharynx: Oropharynx is clear. Uvula midline.     Tonsils: No tonsillar exudate or tonsillar abscesses.      Comments: Poor dentition with multiple cracked teeth and dental caries.  Some gingival erythema to left lower posterior dentition.  No drainable periapical abscess.  No intraoral lesions.  Sublingual area soft.  No drooling, dysphagia or trismus Eyes:     Pupils: Pupils are equal, round, and reactive to light.  Neck:     Trachea: Trachea and phonation normal.     Comments: No neck stiffness or neck rigidity.  Phonation normal Cardiovascular:     Rate and Rhythm: Normal rate and regular rhythm.     Pulses: Normal pulses.     Heart sounds: Normal heart sounds.  Pulmonary:     Effort: Pulmonary effort is normal. No respiratory distress.     Breath sounds: Normal breath sounds and air entry.     Comments: Clear to auscultation without wheeze, rhonchi or rales.  No stridor Abdominal:     General: There is no distension.     Palpations: Abdomen is soft.  Musculoskeletal:        General: Normal range of motion.     Cervical back: Full passive range of motion without pain, normal range of motion and neck supple.  Lymphadenopathy:     Cervical: No cervical adenopathy.  Skin:    General: Skin is warm and dry.     Capillary Refill: Capillary refill takes less than 2 seconds.     Comments: No erythema, warmth, fluctuance or induration.  Neurological:     General: No focal deficit present.     Mental Status: He is alert.     Cranial Nerves: Cranial nerves are intact.      Sensory: Sensation is intact.     Comments: Cranial nerves II through XII grossly intact     ED Results / Procedures / Treatments   Labs (all labs ordered are listed, but only abnormal results are displayed) Labs Reviewed - No data to display  EKG None  Radiology No results found.  Procedures Procedures (including critical care time)  Medications Ordered in ED Medications - No data to display  ED Course  I have reviewed the triage vital signs and the nursing notes.  Pertinent labs & imaging results that were available during my care of the patient were reviewed by me and  considered in my medical decision making (see chart for details).  34 year old male patient otherwise well presents for evaluation of dental pain.  He has multiple dental caries and fractured dentition. This appears to be an ongoing issue and he has been seen for previous in past.  He is afebrile, nonseptic, non-ill-appearing.  He does have some very minimal superior mandibular swelling however does not extend into the submandibular region.  No evidence of Ludwig's angina.  Sublingual area soft.  No drooling, dysphagia or trismus.  Low suspicion for deep space infection.  No evidence of drainable periapical abscess.  Will start on anti-inflammatories, antibiotics and have him follow-up outpatient with dentistry.  The patient has been appropriately medically screened and/or stabilized in the ED. I have low suspicion for any other emergent medical condition which would require further screening, evaluation or treatment in the ED or require inpatient management.  Patient is hemodynamically stable and in no acute distress.  Patient able to ambulate in department prior to ED.  Evaluation does not show acute pathology that would require ongoing or additional emergent interventions while in the emergency department or further inpatient treatment.  I have discussed the diagnosis with the patient and answered all questions.   Pain is been managed while in the emergency department and patient has no further complaints prior to discharge.  Patient is comfortable with plan discussed in room and is stable for discharge at this time.  I have discussed strict return precautions for returning to the emergency department.  Patient was encouraged to follow-up with PCP/specialist refer to at discharge.    MDM Rules/Calculators/A&P                           Final Clinical Impression(s) / ED Diagnoses Final diagnoses:  Pain, dental    Rx / DC Orders ED Discharge Orders         Ordered    naproxen (NAPROSYN) 500 MG tablet  2 times daily     Discontinue  Reprint     12/11/19 1043    penicillin v potassium (VEETID) 500 MG tablet  3 times daily     Discontinue  Reprint     12/11/19 1043           Fatmata Legere A, PA-C 12/11/19 1053    Terrilee Files, MD 12/11/19 1758

## 2019-12-11 NOTE — Discharge Instructions (Signed)
Take medication as prescribed.  Follow-up outpatient with dentistry

## 2019-12-11 NOTE — ED Triage Notes (Signed)
Pt has dental pain 3 tooth from left back, some swelling noted.Broken tooth

## 2020-02-15 ENCOUNTER — Encounter: Payer: Self-pay | Admitting: Physician Assistant

## 2020-02-15 ENCOUNTER — Ambulatory Visit
Admission: EM | Admit: 2020-02-15 | Discharge: 2020-02-15 | Disposition: A | Discharge status: Home / Self Care | Payer: Self-pay | Attending: Physician Assistant | Admitting: Physician Assistant

## 2020-02-15 DIAGNOSIS — Z202 Contact with and (suspected) exposure to infections with a predominantly sexual mode of transmission: Secondary | ICD-10-CM | POA: Insufficient documentation
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
## 2020-02-15 NOTE — Discharge Instructions (Signed)
Cytology sent, you will be contacted with any positive results that requires further treatment. Refrain from sexual activity for the next 7 days. If developing testicular swelling/pain, penile lesion/sore, follow up for reevaluation.   
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
## 2020-02-19 LAB — CYTOLOGY, (ORAL, ANAL, URETHRAL) ANCILLARY ONLY
Chlamydia: NEGATIVE
Comment: NEGATIVE
Comment: NEGATIVE
Comment: NORMAL
Neisseria Gonorrhea: NEGATIVE
Trichomonas: NEGATIVE
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
## 2021-02-04 ENCOUNTER — Emergency Department (HOSPITAL_BASED_OUTPATIENT_CLINIC_OR_DEPARTMENT_OTHER)
Admission: EM | Admit: 2021-02-04 | Discharge: 2021-02-04 | Disposition: A | Discharge status: Home / Self Care | Payer: Self-pay | Attending: Emergency Medicine | Admitting: Emergency Medicine

## 2021-02-04 ENCOUNTER — Other Ambulatory Visit: Payer: Self-pay

## 2021-02-04 ENCOUNTER — Encounter (HOSPITAL_BASED_OUTPATIENT_CLINIC_OR_DEPARTMENT_OTHER): Payer: Self-pay | Admitting: Obstetrics and Gynecology

## 2021-02-04 ENCOUNTER — Encounter: Payer: Self-pay | Admitting: Emergency Medicine

## 2021-02-04 ENCOUNTER — Ambulatory Visit
Admission: EM | Admit: 2021-02-04 | Discharge: 2021-02-04 | Disposition: A | Discharge status: Home / Self Care | Payer: Self-pay | Attending: Urgent Care | Admitting: Urgent Care

## 2021-02-04 DIAGNOSIS — F17210 Nicotine dependence, cigarettes, uncomplicated: Secondary | ICD-10-CM | POA: Insufficient documentation

## 2021-02-04 DIAGNOSIS — Z7251 High risk heterosexual behavior: Secondary | ICD-10-CM | POA: Insufficient documentation

## 2021-02-04 DIAGNOSIS — S31805A Open bite of unspecified buttock, initial encounter: Secondary | ICD-10-CM | POA: Insufficient documentation

## 2021-02-04 DIAGNOSIS — Z202 Contact with and (suspected) exposure to infections with a predominantly sexual mode of transmission: Secondary | ICD-10-CM | POA: Insufficient documentation

## 2021-02-04 DIAGNOSIS — W540XXA Bitten by dog, initial encounter: Secondary | ICD-10-CM | POA: Insufficient documentation

## 2021-02-04 MED ORDER — AMOXICILLIN-POT CLAVULANATE 875-125 MG PO TABS: 1.0000 | ORAL_TABLET | Freq: Two times a day (BID) | ORAL | 0 refills | Status: DC

## 2021-02-04 MED ORDER — AMOXICILLIN-POT CLAVULANATE 875-125 MG PO TABS
1.0000 | ORAL_TABLET | Freq: Once | ORAL | Status: AC
  Administered 2021-02-04: 1 via ORAL
  Filled 2021-02-04: qty 1

## 2021-02-04 NOTE — ED Triage Notes (Signed)
Patient requesting STD testing, no sx's.  No discharge.  No dysuria.

## 2021-02-04 NOTE — ED Provider Notes (Signed)
[facility] EMERGENCY DEPT Provider Note   CSN: [id number] Arrival date & time: 02/04/21  1918     History Chief Complaint  Patient presents with   Animal Bite    Gerald [name] is a 35 y.o. male.  Patient is a 35 yo male presenting for dog bite. Patient states at 745AM this morning he was peeing in the bushes outsides when he was bit by a domestic dog on the buttocks. Admits to pain. Denies redness, swelling, or drainage. Denies other injuries at this time. Tdap up to date-2017.   The history is provided by the patient. No language interpreter was used.  Animal Bite Contact animal:  Dog Animal bite location: buttocks. Pain details:    Quality:  Aching   Severity:  Mild Associated symptoms: no fever and no rash       History reviewed. No pertinent past medical history.  There are no problems to display for this patient.   History reviewed. No pertinent surgical history.     Family History  Problem Relation Age of Onset   Healthy Mother    Healthy Father     Social History   Tobacco Use   Smoking status: Every Day    Packs/day: 0.20    Years: 7.00    Pack years: 1.40    Types: Cigarettes   Smokeless tobacco: Never  Vaping Use   Vaping Use: Never used  Substance Use Topics   Alcohol use: No   Drug use: Yes    Types: Marijuana    Home Medications Prior to Admission medications   Medication Sig Start Date End Date Taking? Authorizing Provider  dicyclomine (BENTYL) 20 MG tablet Take 1 tablet (20 mg total) by mouth 2 (two) times daily. Patient not taking: Reported on 07/09/2019 11/07/16 02/15/20  Garlon Hatchet, PA-C    Allergies    Patient has no known allergies.  Review of Systems   Review of Systems  Constitutional:  Negative for chills and fever.  Eyes:  Negative for pain and visual disturbance.  Respiratory:  Negative for cough and shortness of breath.   Cardiovascular:  Negative for chest pain and palpitations.   Gastrointestinal:  Negative for abdominal pain and vomiting.  Genitourinary:  Negative for dysuria and hematuria.  Musculoskeletal:  Negative for arthralgias and back pain.  Skin:  Positive for wound. Negative for rash.  All other systems reviewed and are negative.  Physical Exam Updated Vital Signs BP 99/68   Pulse 100   Temp 99.4 F (37.4 C) (Oral)   Resp 16   SpO2 99%   Physical Exam Vitals and nursing note reviewed.  Constitutional:      Appearance: He is well-developed.  HENT:     Head: Normocephalic and atraumatic.  Eyes:     Conjunctiva/sclera: Conjunctivae normal.  Cardiovascular:     Rate and Rhythm: Normal rate and regular rhythm.     Heart sounds: No murmur heard. Pulmonary:     Effort: Pulmonary effort is normal. No respiratory distress.     Breath sounds: Normal breath sounds.  Abdominal:     Palpations: Abdomen is soft.     Tenderness: There is no abdominal tenderness.  Musculoskeletal:     Cervical back: Neck supple.  Skin:    General: Skin is warm and dry.       Neurological:     Mental Status: He is alert.    ED Results / Procedures / Treatments   Labs (all labs ordered  are listed, but only abnormal results are displayed) Labs Reviewed - No data to display  EKG None  Radiology No results found.  Procedures Procedures   Medications Ordered in ED Medications - No data to display  ED Course  I have reviewed the triage vital signs and the nursing notes.  Pertinent labs & imaging results that were available during my care of the patient were reviewed by me and considered in my medical decision making (see chart for details).    MDM Rules/Calculators/A&P                          8:02 PM  35 yo male presenting for dog bite. Patient is Aox3, no acute distress, afebrile, with stable vitals. Physical exam demonstrates superficial abrasions to left buttocks. No foreign bodies visualized. Tdap up to date in 2017. Augmentin given in ED for  infection prevention and sent to pharmacy. Pt states this was a domestic dog and has no concerns for rabies.  Patient in no distress and overall condition improved here in the ED. Detailed discussions were had with the patient regarding current findings, and need for close f/u with PCP or on call doctor. The patient has been instructed to return immediately if the symptoms worsen in any way for re-evaluation. Patient verbalized understanding and is in agreement with current care plan. All questions answered prior to discharge.         Final Clinical Impression(s) / ED Diagnoses Final diagnoses:  Dog bite, initial encounter    Rx / DC Orders ED Discharge Orders     None        Franne Forts, DO 02/04/21 2005

## 2021-02-04 NOTE — ED Triage Notes (Signed)
Patient reports he was peeing in the bushes and a dog bit his left butt cheek. Patient did not stick around to ask the vaccination status of the dog
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
## 2021-02-04 NOTE — Discharge Instructions (Addendum)
Keep area clean and dry. Complete antibiotics to prevent infection.

## 2021-02-05 LAB — RPR: RPR Ser Ql: NONREACTIVE

## 2021-02-05 LAB — HIV ANTIBODY (ROUTINE TESTING W REFLEX): HIV Screen 4th Generation wRfx: NONREACTIVE

## 2021-02-06 LAB — CYTOLOGY, (ORAL, ANAL, URETHRAL) ANCILLARY ONLY
Chlamydia: NEGATIVE
Comment: NEGATIVE
Comment: NEGATIVE
Comment: NORMAL
Neisseria Gonorrhea: NEGATIVE
Trichomonas: NEGATIVE

## 2021-10-23 ENCOUNTER — Ambulatory Visit
Admission: EM | Admit: 2021-10-23 | Discharge: 2021-10-23 | Disposition: A | Discharge status: Home / Self Care | Payer: Self-pay | Attending: Family Medicine | Admitting: Family Medicine

## 2021-10-23 DIAGNOSIS — R300 Dysuria: Secondary | ICD-10-CM | POA: Insufficient documentation

## 2021-10-23 DIAGNOSIS — Z113 Encounter for screening for infections with a predominantly sexual mode of transmission: Secondary | ICD-10-CM | POA: Insufficient documentation

## 2021-10-23 LAB — POCT URINALYSIS DIP (MANUAL ENTRY)
Bilirubin, UA: NEGATIVE
Glucose, UA: NEGATIVE mg/dL
Ketones, POC UA: NEGATIVE mg/dL
Nitrite, UA: NEGATIVE
Protein Ur, POC: NEGATIVE mg/dL
Spec Grav, UA: 1.01 (ref 1.010–1.025)
Urobilinogen, UA: 1 E.U./dL
pH, UA: 7.5 (ref 5.0–8.0)

## 2021-10-23 NOTE — Discharge Instructions (Signed)
The urinalysis was normal.  ? ?Staff will call you if anything is positive on the swab. ?

## 2021-10-23 NOTE — ED Triage Notes (Signed)
Pt c/o dysuria, frequency onset ~ 2 days ago.  ? ?Denies hematuria, pelvic pain, new lower back pain, skin lesions/sores, discharge, odors,  ? ?Denies known exposure.  ?

## 2021-10-23 NOTE — ED Provider Notes (Addendum)
?[facility] ? ? ? ?CSN: [id number] ?Arrival date & time: 10/23/21  1835 ? ? ?  ? ?History   ?Chief Complaint ?Chief Complaint  ?Patient presents with  ? sti testing  ? ? ?HPI ?Gerald [name] is a 36 y.o. male.  ? ?HPI ?Here for 2 day h/o dysuria. Maybe some urinary frequency. No dc or itching. No f/c/n/v/abd pain. ? ? ? ?History reviewed. No pertinent past medical history. ? ?There are no problems to display for this patient. ? ? ?History reviewed. No pertinent surgical history. ? ? ? ? ?Home Medications   ? ?Prior to Admission medications   ?Medication Sig Start Date End Date Taking? Authorizing Provider  ?dicyclomine (BENTYL) 20 MG tablet Take 1 tablet (20 mg total) by mouth 2 (two) times daily. ?Patient not taking: Reported on 07/09/2019 11/07/16 02/15/20  Larene Pickett, PA-C  ? ? ?Family History ?Family History  ?Problem Relation Age of Onset  ? Healthy Mother   ? Healthy Father   ? ? ?Social History ?Social History  ? ?Tobacco Use  ? Smoking status: Every Day  ?  Packs/day: 0.20  ?  Years: 7.00  ?  Pack years: 1.40  ?  Types: Cigarettes  ? Smokeless tobacco: Never  ?Vaping Use  ? Vaping Use: Never used  ?Substance Use Topics  ? Alcohol use: No  ? Drug use: Yes  ?  Types: Marijuana  ? ? ? ?Allergies   ?Patient has no known allergies. ? ? ?Review of Systems ?Review of Systems ? ? ?Physical Exam ?Triage Vital Signs ?ED Triage Vitals [10/23/21 1842]  ?Enc Vitals Group  ?   BP 116/68  ?   Pulse Rate 82  ?   Resp 18  ?   Temp 98.5 ?F (36.9 ?C)  ?   Temp Source Oral  ?   SpO2 98 %  ?   Weight   ?   Height   ?   Head Circumference   ?   Peak Flow   ?   Pain Score 0  ?   Pain Loc   ?   Pain Edu?   ?   Excl. in [place]?   ? ?No data found. ? ?Updated Vital Signs ?BP 116/68 (BP Location: Left Arm)   Pulse 82   Temp 98.5 ?F (36.9 ?C) (Oral)   Resp 18   SpO2 98%  ? ?Visual Acuity ?Right Eye Distance:   ?Left Eye Distance:   ?Bilateral Distance:   ? ?Right Eye Near:   ?Left Eye Near:    ?Bilateral Near:     ? ?Physical Exam ?Vitals reviewed.  ?Constitutional:   ?   General: He is not in acute distress. ?   Appearance: He is not ill-appearing, toxic-appearing or diaphoretic.  ?HENT:  ?   Mouth/Throat:  ?   Mouth: Mucous membranes are moist.  ?Cardiovascular:  ?   Rate and Rhythm: Normal rate and regular rhythm.  ?   Heart sounds: No murmur heard. ?Pulmonary:  ?   Effort: Pulmonary effort is normal.  ?   Breath sounds: Normal breath sounds.  ?Skin: ?   Coloration: Skin is not pale.  ?Neurological:  ?   Mental Status: He is alert and oriented to person, place, and time.  ?Psychiatric:     ?   Behavior: Behavior normal.  ? ? ? ?UC Treatments / Results  ?Labs ?(all labs ordered are listed, but only abnormal results are displayed) ?Labs Reviewed  ?  POCT URINALYSIS DIP (MANUAL ENTRY) - Abnormal; Notable for the following components:  ?    Result Value  ? Blood, UA trace-intact (*)   ? Leukocytes, UA Trace (*)   ? All other components within normal limits  ?HIV ANTIBODY (ROUTINE TESTING W REFLEX)  ?RPR  ?CYTOLOGY, (ORAL, ANAL, URETHRAL) ANCILLARY ONLY  ? ? ?EKG ? ? ?Radiology ?No results found. ? ?Procedures ?Procedures (including critical care time) ? ?Medications Ordered in UC ?Medications - No data to display ? ?Initial Impression / Assessment and Plan / UC Course  ?I have reviewed the triage vital signs and the nursing notes. ? ?Pertinent labs & imaging results that were available during my care of the patient were reviewed by me and considered in my medical decision making (see chart for details). ? ?  ? ?UA with trace WBC and RBC, I doubt urinary tract source of his symptoms. Staff will notify him of any positives on the swab, and tx per protocol. HIV and RPR screen done also at his request ?Final Clinical Impressions(s) / UC Diagnoses  ? ?Final diagnoses:  ?Dysuria  ?Screening for STD (sexually transmitted disease)  ? ? ? ?Discharge Instructions   ? ?  ?The urinalysis was normal.  ? ?Staff will call you if anything is  positive on the swab. ? ? ? ? ?ED Prescriptions   ?None ?  ? ?PDMP not reviewed this encounter. ?  ?Barrett Henle, MD ?10/23/21 1857 ? ?  ?Barrett Henle, MD ?10/23/21 1857 ? ?

## 2021-10-25 LAB — HIV ANTIBODY (ROUTINE TESTING W REFLEX): HIV Screen 4th Generation wRfx: NONREACTIVE

## 2021-10-25 LAB — RPR: RPR Ser Ql: NONREACTIVE

## 2021-10-26 LAB — CYTOLOGY, (ORAL, ANAL, URETHRAL) ANCILLARY ONLY
Chlamydia: NEGATIVE
Comment: NEGATIVE
Comment: NEGATIVE
Comment: NORMAL
Neisseria Gonorrhea: NEGATIVE
Trichomonas: POSITIVE — AB

## 2021-10-27 ENCOUNTER — Telehealth (HOSPITAL_COMMUNITY): Payer: Self-pay | Admitting: Emergency Medicine

## 2021-10-27 MED ORDER — METRONIDAZOLE 500 MG PO TABS: 2000.0000 mg | ORAL_TABLET | Freq: Once | ORAL | 0 refills | Status: AC

## 2021-11-18 ENCOUNTER — Other Ambulatory Visit: Payer: Self-pay

## 2021-11-18 ENCOUNTER — Emergency Department (HOSPITAL_BASED_OUTPATIENT_CLINIC_OR_DEPARTMENT_OTHER)
Admission: EM | Admit: 2021-11-18 | Discharge: 2021-11-18 | Disposition: A | Discharge status: Home / Self Care | Payer: Self-pay | Attending: Emergency Medicine | Admitting: Emergency Medicine

## 2021-11-18 ENCOUNTER — Encounter (HOSPITAL_BASED_OUTPATIENT_CLINIC_OR_DEPARTMENT_OTHER): Payer: Self-pay

## 2021-11-18 DIAGNOSIS — Z5321 Procedure and treatment not carried out due to patient leaving prior to being seen by health care provider: Secondary | ICD-10-CM | POA: Insufficient documentation

## 2021-11-18 DIAGNOSIS — R1909 Other intra-abdominal and pelvic swelling, mass and lump: Secondary | ICD-10-CM | POA: Insufficient documentation

## 2021-11-18 NOTE — ED Triage Notes (Signed)
Pt reports he noticed a bump in his pubic region. States it looks like an ingrown hair follicle. Denies drainage or any other symptoms.

## 2021-11-20 ENCOUNTER — Other Ambulatory Visit: Payer: Self-pay

## 2021-11-20 ENCOUNTER — Emergency Department (HOSPITAL_BASED_OUTPATIENT_CLINIC_OR_DEPARTMENT_OTHER)
Admission: EM | Admit: 2021-11-20 | Discharge: 2021-11-20 | Disposition: A | Discharge status: Home / Self Care | Payer: Self-pay | Attending: Emergency Medicine | Admitting: Emergency Medicine

## 2021-11-20 ENCOUNTER — Encounter (HOSPITAL_BASED_OUTPATIENT_CLINIC_OR_DEPARTMENT_OTHER): Payer: Self-pay | Admitting: Emergency Medicine

## 2021-11-20 DIAGNOSIS — Z5321 Procedure and treatment not carried out due to patient leaving prior to being seen by health care provider: Secondary | ICD-10-CM | POA: Insufficient documentation

## 2021-11-20 DIAGNOSIS — N4829 Other inflammatory disorders of penis: Secondary | ICD-10-CM | POA: Insufficient documentation

## 2021-11-20 NOTE — ED Triage Notes (Signed)
Reports a painful bump to penis. Denies discharge.

## 2021-11-21 ENCOUNTER — Other Ambulatory Visit: Payer: Self-pay

## 2021-11-21 ENCOUNTER — Encounter (HOSPITAL_BASED_OUTPATIENT_CLINIC_OR_DEPARTMENT_OTHER): Payer: Self-pay

## 2021-11-21 ENCOUNTER — Emergency Department (HOSPITAL_BASED_OUTPATIENT_CLINIC_OR_DEPARTMENT_OTHER)
Admission: EM | Admit: 2021-11-21 | Discharge: 2021-11-21 | Disposition: A | Discharge status: Home / Self Care | Payer: Self-pay | Attending: Emergency Medicine | Admitting: Emergency Medicine

## 2021-11-21 DIAGNOSIS — Z113 Encounter for screening for infections with a predominantly sexual mode of transmission: Secondary | ICD-10-CM

## 2021-11-21 DIAGNOSIS — Z202 Contact with and (suspected) exposure to infections with a predominantly sexual mode of transmission: Secondary | ICD-10-CM | POA: Insufficient documentation

## 2021-11-21 DIAGNOSIS — L731 Pseudofolliculitis barbae: Secondary | ICD-10-CM | POA: Insufficient documentation

## 2021-11-21 LAB — HIV ANTIBODY (ROUTINE TESTING W REFLEX): HIV Screen 4th Generation wRfx: NONREACTIVE

## 2021-11-21 NOTE — ED Notes (Signed)
Dc instructions reviewed with patient. Patient voiced understanding. Dc with belongings.  °

## 2021-11-21 NOTE — ED Triage Notes (Signed)
Pt reports he noticed a bump in his pubic region on the 7th. States it looks like an ingrown hair follicle. Denies drainage or any other symptoms.  Pt has visited the ed a couple of times but did not stay due to wait times.

## 2021-11-21 NOTE — Discharge Instructions (Addendum)
Your STI test results will be available on the online patient portal. Please allow for 2 days for the CG/Chlamydia test to result.

## 2021-11-21 NOTE — ED Provider Notes (Signed)
[facility] EMERGENCY DEPT Provider Note   CSN: [id number] Arrival date & time: 11/21/21  0915     History  Chief Complaint  Patient presents with   Rash    Gerald [name] is a 36 y.o. male.   Rash   36 year old male presenting to the emergency department with a bump in his pubic region at the base of his penile shaft which has been present for the past 3 days.  He states that it initially felt like an ingrown hair follicle but has become somewhat painful.  He denies any drainage or redness.  He denies any fevers or chills.  He has not yet had this evaluated.  He also request STI testing as he had trichomonas last month that was treated.  He has not had any new sexual partners.  Home Medications Prior to Admission medications   Medication Sig Start Date End Date Taking? Authorizing Provider  dicyclomine (BENTYL) 20 MG tablet Take 1 tablet (20 mg total) by mouth 2 (two) times daily. Patient not taking: Reported on 07/09/2019 11/07/16 02/15/20  Garlon Hatchet, PA-C      Allergies    Patient has no known allergies.    Review of Systems   Review of Systems  Skin:  Positive for rash.  All other systems reviewed and are negative.   Physical Exam Updated Vital Signs BP 134/72   Pulse 78   Temp 98.1 F (36.7 C)   Resp 15   Ht 5\' 9"  (1.753 m)   Wt 63.5 kg   SpO2 99%   BMI 20.67 kg/m  Physical Exam Vitals and nursing note reviewed. Exam conducted with a chaperone present.  Constitutional:      General: He is not in acute distress. HENT:     Head: Normocephalic and atraumatic.  Eyes:     Conjunctiva/sclera: Conjunctivae normal.     Pupils: Pupils are equal, round, and reactive to light.  Cardiovascular:     Rate and Rhythm: Normal rate and regular rhythm.  Pulmonary:     Effort: Pulmonary effort is normal. No respiratory distress.  Abdominal:     General: There is no distension.     Tenderness: There is no guarding.  Genitourinary:    Comments:  Small papule along the base of the shaft of the penis, minimal TTP, no erythema or drainage, no other lesions noted Musculoskeletal:        General: No deformity or signs of injury.     Cervical back: Neck supple.  Skin:    Findings: No lesion or rash.  Neurological:     General: No focal deficit present.     Mental Status: He is alert. Mental status is at baseline.     ED Results / Procedures / Treatments   Labs (all labs ordered are listed, but only abnormal results are displayed) Labs Reviewed  HIV ANTIBODY (ROUTINE TESTING W REFLEX)  RPR  HSV 1 ANTIBODY, IGG  HSV 2 ANTIBODY, IGG  GC/CHLAMYDIA PROBE AMP ([place]) NOT AT [facility]  URINE CYTOLOGY ANCILLARY ONLY    EKG None  Radiology No results found.  Procedures Procedures    Medications Ordered in ED Medications - No data to display  ED Course/ Medical Decision Making/ A&P                           Medical Decision Making Amount and/or Complexity of Data Reviewed Labs: ordered.    36 year old  male presenting to the emergency department with a bump in his pubic region at the base of his penile shaft which has been present for the past 3 days.  He states that it initially felt like an ingrown hair follicle but has become somewhat painful.  He denies any drainage or redness.  He denies any fevers or chills.  He has not yet had this evaluated.  He also request STI testing as he had trichomonas last month that was treated.  He has not had any new sexual partners.  On arrival, the patient was vitally stable.  Physical exam significant for likely ingrown hair follicle that does not appear overly infected.  Advised warm compresses for management, return precautions provided.  Patient is requesting STI testing.  He consents to HIV testing. He states "test me for everything."  On arrival, the patient was vitally stable.  Physical exam significant for likely ingrown hair without evidence of folliculitis or other surrounding  cellulitis or infection.  Patient request STI testing which was performed and pending.  He was advised to follow-up regarding his results on the patient portal.  Stable for discharge  Final Clinical Impression(s) / ED Diagnoses Final diagnoses:  Ingrown hair  Routine screening for STI (sexually transmitted infection)    Rx / DC Orders ED Discharge Orders          Ordered    Monkeypox Virus DNA, Qualitative Real-Time PCR  Status:  Canceled        11/21/21 0350              Ernie Avena, MD 11/21/21 1156

## 2021-11-22 LAB — RPR: RPR Ser Ql: NONREACTIVE

## 2021-11-23 LAB — GC/CHLAMYDIA PROBE AMP (~~LOC~~) NOT AT ARMC
Chlamydia: NEGATIVE
Comment: NEGATIVE
Comment: NORMAL
Neisseria Gonorrhea: NEGATIVE

## 2021-11-24 LAB — HSV 1 ANTIBODY, IGG: HSV 1 Glycoprotein G Ab, IgG: <0.91 index (ref 0.00–0.90)

## 2021-11-24 LAB — HSV 2 ANTIBODY, IGG: HSV 2 Glycoprotein G Ab, IgG: <0.91 index (ref 0.00–0.90)

## 2024-04-30 ENCOUNTER — Emergency Department (HOSPITAL_COMMUNITY)

## 2024-04-30 ENCOUNTER — Other Ambulatory Visit: Payer: Self-pay

## 2024-04-30 ENCOUNTER — Emergency Department (HOSPITAL_COMMUNITY)
Admission: EM | Admit: 2024-04-30 | Discharge: 2024-05-01 | Disposition: A | Discharge status: Home / Self Care | Attending: Emergency Medicine | Admitting: Emergency Medicine

## 2024-04-30 ENCOUNTER — Encounter (HOSPITAL_COMMUNITY): Payer: Self-pay

## 2024-04-30 DIAGNOSIS — W231XXA Caught, crushed, jammed, or pinched between stationary objects, initial encounter: Secondary | ICD-10-CM | POA: Insufficient documentation

## 2024-04-30 DIAGNOSIS — S92301A Fracture of unspecified metatarsal bone(s), right foot, initial encounter for closed fracture: Secondary | ICD-10-CM | POA: Diagnosis not present

## 2024-04-30 DIAGNOSIS — Y990 Civilian activity done for income or pay: Secondary | ICD-10-CM | POA: Insufficient documentation

## 2024-04-30 DIAGNOSIS — S92309A Fracture of unspecified metatarsal bone(s), unspecified foot, initial encounter for closed fracture: Secondary | ICD-10-CM

## 2024-04-30 DIAGNOSIS — S92221A Displaced fracture of lateral cuneiform of right foot, initial encounter for closed fracture: Secondary | ICD-10-CM | POA: Insufficient documentation

## 2024-04-30 DIAGNOSIS — S92254A Nondisplaced fracture of navicular [scaphoid] of right foot, initial encounter for closed fracture: Secondary | ICD-10-CM | POA: Insufficient documentation

## 2024-04-30 DIAGNOSIS — S99921A Unspecified injury of right foot, initial encounter: Secondary | ICD-10-CM | POA: Diagnosis present

## 2024-04-30 NOTE — ED Triage Notes (Signed)
 Pt states that he hurt his right foot at work today by stepping down and not having enough room in between pallets.

## 2024-05-01 MED ORDER — OXYCODONE-ACETAMINOPHEN 5-325 MG PO TABS
1.0000 | ORAL_TABLET | Freq: Once | ORAL | Status: AC
  Administered 2024-05-01: 1 via ORAL
  Filled 2024-05-01: qty 1

## 2024-05-01 MED ORDER — OXYCODONE HCL 5 MG PO TABS: 5.0000 mg | ORAL_TABLET | ORAL | 0 refills | Status: AC | PRN

## 2024-05-01 NOTE — ED Provider Notes (Signed)
 [place] EMERGENCY DEPARTMENT AT [facility] Provider Note   CSN: [id number] Arrival date & time: 04/30/24  2101     Patient presents with: Foot Pain   Gerald [name] is a 38 y.o. male with non-contributory PMHx who presents to ED concerned for right foot pain. Injury happened at work today when patient got his foot caught between pallets and he fell. Pain is on dorsal side of right foot. Patient denies any other pain today or acute symptoms.     Foot Pain       Prior to Admission medications   Medication Sig Start Date End Date Taking? Authorizing Provider  oxyCODONE (ROXICODONE) 5 MG immediate release tablet Take 1 tablet (5 mg total) by mouth every 4 (four) hours as needed for up to 10 doses. 05/01/24  Yes Hoy Fraction F, PA-C  dicyclomine  (BENTYL ) 20 MG tablet Take 1 tablet (20 mg total) by mouth 2 (two) times daily. Patient not taking: Reported on 07/09/2019 11/07/16 02/15/20  Jarold Olam HERO, PA-C    Allergies: Patient has no known allergies.    Review of Systems  Musculoskeletal:        Foot pain    Updated Vital Signs BP 116/77 (BP Location: Right Arm)   Pulse 82   Temp 98.4 F (36.9 C) (Oral)   Resp 17   SpO2 98%   Physical Exam Vitals and nursing note reviewed.  Constitutional:      General: He is not in acute distress.    Appearance: He is not ill-appearing or toxic-appearing.  HENT:     Head: Normocephalic and atraumatic.  Eyes:     General: No scleral icterus.       Right eye: No discharge.        Left eye: No discharge.     Conjunctiva/sclera: Conjunctivae normal.  Cardiovascular:     Rate and Rhythm: Normal rate.  Pulmonary:     Effort: Pulmonary effort is normal.  Abdominal:     General: Abdomen is flat.  Musculoskeletal:     Comments: Right foot: Mild swelling and tenderness along dorsal aspect.  +2 pedal pulse.  Sensation to light touch intact.  Active ROM moderately restricted due to pain.  No erythema or increased  warmth.  Area N/V intact.  Area nontense.  Skin:    General: Skin is warm and dry.  Neurological:     General: No focal deficit present.     Mental Status: He is alert. Mental status is at baseline.  Psychiatric:        Mood and Affect: Mood normal.        Behavior: Behavior normal.     (all labs ordered are listed, but only abnormal results are displayed) Labs Reviewed - No data to display  EKG: None  Radiology: CT Foot Right Wo Contrast Result Date: 04/30/2024 CLINICAL DATA:  Foot injury EXAM: CT OF THE RIGHT FOOT WITHOUT CONTRAST TECHNIQUE: Multidetector CT imaging of the right foot was performed according to the standard protocol. Multiplanar CT image reconstructions were also generated. RADIATION DOSE REDUCTION: This exam was performed according to the departmental dose-optimization program which includes automated exposure control, adjustment of the mA and/or kV according to patient size and/or use of iterative reconstruction technique. COMPARISON:  Right foot x-ray same day FINDINGS: Bones/Joint/Cartilage There are acute nondisplaced fractures through the dorsal bases of the third and fourth metatarsals. There acute fractures of the dorsal aspect of the middle and lateral cuneiforms, nondisplaced. There is  also small acute avulsion fracture from the medial dorsal aspect of the navicular bone. Joint spaces are well maintained. Dislocation. Ligaments Suboptimally assessed by CT. Muscles and Tendons Within normal limits. Soft tissues There is edema overlying fractures. No focal hematoma or foreign body identified. IMPRESSION: 1. Acute nondisplaced fractures through the dorsal bases of the third and fourth metatarsals. 2. Acute fractures of the dorsal aspect of the middle and lateral cuneiforms. 3. Small acute avulsion fracture from the medial dorsal aspect of the navicular bone. Electronically Signed   By: Greig Pique M.D.   On: 04/30/2024 23:43   DG Foot Complete Right Result Date:  04/30/2024 CLINICAL DATA:  Status post trauma. EXAM: RIGHT FOOT COMPLETE - 3+ VIEW COMPARISON:  None Available. FINDINGS: Two small, mildly displaced fracture fragments are seen along the dorsal aspects of the cuneiform bones on the lateral view. There is no evidence of dislocation. There is no evidence of arthropathy or other focal bone abnormality. Mild dorsal soft tissue swelling is seen. IMPRESSION: Small, mildly displaced fracture fragments along the dorsal aspects of the cuneiform bones. CT correlation is recommended. Electronically Signed   By: Suzen Dials M.D.   On: 04/30/2024 22:31     Procedures   Medications Ordered in the ED  oxyCODONE-acetaminophen (PERCOCET/ROXICET) 5-325 MG per tablet 1 tablet (1 tablet Oral Given 05/01/24 0033)                                    Medical Decision Making Amount and/or Complexity of Data Reviewed Radiology: ordered.  Risk Prescription drug management.   This patient presents to the ED for concern of foot pain, this involves an extensive number of treatment options, and is a complaint that carries with it a high risk of complications and morbidity.  The differential diagnosis includes hemarthrosis, gout, septic joint, fracture, tendonitis, carpal tunnel syndrome, muscle strain, bursitis, compartment syndrome   Co morbidities that complicate the patient evaluation  none   Additional history obtained:  No PCP listed in chart    Problem List / ED Course / Critical interventions / Medication management  Patient presents to ED concern for right foot pain after a fall at work.  Physical exam with mild swelling and tenderness to dorsal aspect of right foot.  Rest of physical exam reassuring.  Patient afebrile with stable vitals. I ordered imaging studies including right foot x-ray and CT. I independently visualized and interpreted imaging. I agree with the radiologist interpretation of acute nondisplaced fractures to the dorsal bases  of the 3rd and 4th metatarsals.  Acute fractures of the dorsal aspect of the middle and lateral cuneiforms.  Small acute avulsion fracture from the medial dorsal aspect of the navicular bone.  Shared results with patient.  Answered all questions.  Provided patient with cam boot and crutches.  Recommended following up with orthopedics.  Educated patient on alternating Advil  and Tylenol for pain control.  Also provided a couple doses of oxycodone 5 mg for breakthrough pain.  Patient agreeable to plan. I have reviewed the patients home medicines and have made adjustments as needed The patient has been appropriately medically screened and/or stabilized in the ED. I have low suspicion for any other emergent medical condition which would require further screening, evaluation or treatment in the ED or require inpatient management. At time of discharge the patient is hemodynamically stable and in no acute distress. I have discussed work-up results  and diagnosis with patient and answered all questions. Patient is agreeable with discharge plan. We discussed strict return precautions for returning to the emergency department and they verbalized understanding.     Social Determinants of Health:  None      Final diagnoses:  Closed nondisplaced fracture of navicular bone of right foot, initial encounter  Closed fracture of multiple metatarsal bones  Closed displaced fracture of lateral cuneiform of right foot, initial encounter    ED Discharge Orders          Ordered    oxyCODONE (ROXICODONE) 5 MG immediate release tablet  Every 4 hours PRN       Note to Pharmacy: Foot fracture ICD-10 S92   05/01/24 0056               Hoy Nidia FALCON, PA-C 05/01/24 0057    Raford Lenis, MD 05/01/24 [phone]

## 2024-05-01 NOTE — Discharge Instructions (Addendum)
 As discussed, you will need to call orthopedic surgery first thing tomorrow morning for a follow-up appointment.  Please remain nonweight bearing of this foot.  Seek emergency care if experiencing any new or worsening symptoms.  Alternating between 650 mg Tylenol and 400 mg Advil : The best way to alternate taking Acetaminophen (example Tylenol) and Ibuprofen  (example Advil /Motrin ) is to take them 3 hours apart. For example, if you take ibuprofen  at 6 am you can then take Tylenol at 9 am. You can continue this regimen throughout the day, making sure you do not exceed the recommended maximum dose for each drug.

## 2024-05-02 ENCOUNTER — Telehealth: Payer: Self-pay | Admitting: Orthopaedic Surgery

## 2024-05-02 NOTE — Telephone Encounter (Signed)
 Patient called coming from the ER, and said that he has a broken R foot and needs to be seen. CB#[phone]

## 2024-05-03 ENCOUNTER — Ambulatory Visit (HOSPITAL_BASED_OUTPATIENT_CLINIC_OR_DEPARTMENT_OTHER): Admitting: Student
